# Patient Record
Sex: Male | Born: 1940 | Race: Black or African American | Hispanic: No | Marital: Single | Smoking: Current every day smoker
Health system: Southern US, Community
[De-identification: ages and names within clinical notes are randomized; demographics above are authoritative.]

## PROBLEM LIST (undated history)

## (undated) DIAGNOSIS — I10 Essential (primary) hypertension: Secondary | ICD-10-CM

## (undated) HISTORY — PX: HERNIA REPAIR: SHX51

---

## 2018-06-17 ENCOUNTER — Inpatient Hospital Stay (HOSPITAL_BASED_OUTPATIENT_CLINIC_OR_DEPARTMENT_OTHER)
Admission: EM | Admit: 2018-06-17 | Discharge: 2018-06-21 | DRG: 343 | Disposition: A | Discharge status: Home / Self Care | Payer: Medicare HMO | Attending: Surgery | Admitting: Surgery

## 2018-06-17 ENCOUNTER — Encounter (HOSPITAL_BASED_OUTPATIENT_CLINIC_OR_DEPARTMENT_OTHER): Payer: Self-pay

## 2018-06-17 ENCOUNTER — Emergency Department (HOSPITAL_BASED_OUTPATIENT_CLINIC_OR_DEPARTMENT_OTHER): Payer: Medicare HMO

## 2018-06-17 ENCOUNTER — Other Ambulatory Visit: Payer: Self-pay

## 2018-06-17 DIAGNOSIS — K3532 Acute appendicitis with perforation and localized peritonitis, without abscess: Secondary | ICD-10-CM | POA: Diagnosis present

## 2018-06-17 DIAGNOSIS — K3580 Unspecified acute appendicitis: Secondary | ICD-10-CM

## 2018-06-17 DIAGNOSIS — I10 Essential (primary) hypertension: Secondary | ICD-10-CM | POA: Diagnosis present

## 2018-06-17 DIAGNOSIS — I77811 Abdominal aortic ectasia: Secondary | ICD-10-CM | POA: Diagnosis present

## 2018-06-17 DIAGNOSIS — A599 Trichomoniasis, unspecified: Secondary | ICD-10-CM | POA: Diagnosis not present

## 2018-06-17 DIAGNOSIS — K352 Acute appendicitis with generalized peritonitis, without abscess: Secondary | ICD-10-CM | POA: Diagnosis not present

## 2018-06-17 DIAGNOSIS — K37 Unspecified appendicitis: Secondary | ICD-10-CM | POA: Diagnosis present

## 2018-06-17 DIAGNOSIS — F17200 Nicotine dependence, unspecified, uncomplicated: Secondary | ICD-10-CM | POA: Diagnosis present

## 2018-06-17 DIAGNOSIS — R935 Abnormal findings on diagnostic imaging of other abdominal regions, including retroperitoneum: Secondary | ICD-10-CM

## 2018-06-17 HISTORY — DX: Essential (primary) hypertension: I10

## 2018-06-17 LAB — URINALYSIS, MICROSCOPIC (REFLEX): WBC, UA: >50 WBC/hpf (ref 0–5)

## 2018-06-17 LAB — CBC
HCT: 45.4 % (ref 39.0–52.0)
Hemoglobin: 14.9 g/dL (ref 13.0–17.0)
MCH: 32.5 pg (ref 26.0–34.0)
MCHC: 32.8 g/dL (ref 30.0–36.0)
MCV: 99.1 fL (ref 80.0–100.0)
Platelets: 211 10*3/uL (ref 150–400)
RBC: 4.58 MIL/uL (ref 4.22–5.81)
RDW: 12.5 % (ref 11.5–15.5)
WBC: 17.6 10*3/uL — ABNORMAL HIGH (ref 4.0–10.5)
nRBC: 0 % (ref 0.0–0.2)

## 2018-06-17 LAB — COMPREHENSIVE METABOLIC PANEL
ALT: 12 U/L (ref 0–44)
AST: 22 U/L (ref 15–41)
Albumin: 4.3 g/dL (ref 3.5–5.0)
Alkaline Phosphatase: 89 U/L (ref 38–126)
Anion gap: 7 (ref 5–15)
BILIRUBIN TOTAL: 0.9 mg/dL (ref 0.3–1.2)
BUN: 9 mg/dL (ref 8–23)
CO2: 30 mmol/L (ref 22–32)
CREATININE: 0.91 mg/dL (ref 0.61–1.24)
Calcium: 9.4 mg/dL (ref 8.9–10.3)
Chloride: 96 mmol/L — ABNORMAL LOW (ref 98–111)
GFR calc Af Amer: >60 mL/min (ref >60)
GFR calc non Af Amer: >60 mL/min (ref >60)
Glucose, Bld: 137 mg/dL — ABNORMAL HIGH (ref 70–99)
Potassium: 3.5 mmol/L (ref 3.5–5.1)
Sodium: 133 mmol/L — ABNORMAL LOW (ref 135–145)
TOTAL PROTEIN: 8 g/dL (ref 6.5–8.1)

## 2018-06-17 LAB — URINALYSIS, ROUTINE W REFLEX MICROSCOPIC
BILIRUBIN URINE: NEGATIVE
Glucose, UA: NEGATIVE mg/dL
Ketones, ur: NEGATIVE mg/dL
Nitrite: NEGATIVE
Protein, ur: NEGATIVE mg/dL
Specific Gravity, Urine: 1.01 (ref 1.005–1.030)
pH: 6.5 (ref 5.0–8.0)

## 2018-06-17 LAB — LIPASE, BLOOD: Lipase: 28 U/L (ref 11–51)

## 2018-06-17 MED ORDER — ONDANSETRON HCL 4 MG/2ML IJ SOLN
4.0000 mg | Freq: Once | INTRAMUSCULAR | Status: AC
  Administered 2018-06-17: 4 mg via INTRAVENOUS
  Filled 2018-06-17: qty 2

## 2018-06-17 MED ORDER — SODIUM CHLORIDE 0.9 % IV SOLN
2.0000 g | INTRAVENOUS | Status: DC
  Administered 2018-06-17: 2 g via INTRAVENOUS
  Filled 2018-06-17: qty 20

## 2018-06-17 MED ORDER — KCL IN DEXTROSE-NACL 20-5-0.9 MEQ/L-%-% IV SOLN
INTRAVENOUS | Status: DC
  Administered 2018-06-18: 01:00:00 via INTRAVENOUS
  Filled 2018-06-17 (×2): qty 1000

## 2018-06-17 MED ORDER — PIPERACILLIN-TAZOBACTAM 3.375 G IVPB 30 MIN
3.3750 g | Freq: Once | INTRAVENOUS | Status: AC
  Administered 2018-06-17: 3.375 g via INTRAVENOUS
  Filled 2018-06-17 (×2): qty 50

## 2018-06-17 MED ORDER — MORPHINE SULFATE (PF) 4 MG/ML IV SOLN
4.0000 mg | Freq: Once | INTRAVENOUS | Status: AC
  Administered 2018-06-17: 4 mg via INTRAVENOUS
  Filled 2018-06-17: qty 1

## 2018-06-17 MED ORDER — ONDANSETRON HCL 4 MG/2ML IJ SOLN: 4.0000 mg | Freq: Four times a day (QID) | INTRAMUSCULAR | Status: DC | PRN

## 2018-06-17 MED ORDER — ONDANSETRON 4 MG PO TBDP: 4.0000 mg | ORAL_TABLET | Freq: Four times a day (QID) | ORAL | Status: DC | PRN

## 2018-06-17 MED ORDER — PANTOPRAZOLE SODIUM 40 MG IV SOLR
40.0000 mg | Freq: Every day | INTRAVENOUS | Status: DC
  Administered 2018-06-17: 40 mg via INTRAVENOUS
  Filled 2018-06-17: qty 40

## 2018-06-17 MED ORDER — MORPHINE SULFATE (PF) 2 MG/ML IV SOLN
1.0000 mg | INTRAVENOUS | Status: DC | PRN
  Administered 2018-06-18 (×2): 4 mg via INTRAVENOUS
  Filled 2018-06-17 (×2): qty 2

## 2018-06-17 MED ORDER — IOPAMIDOL (ISOVUE-300) INJECTION 61%
100.0000 mL | Freq: Once | INTRAVENOUS | Status: AC | PRN
  Administered 2018-06-17: 100 mL via INTRAVENOUS

## 2018-06-17 MED ORDER — METRONIDAZOLE IN NACL 5-0.79 MG/ML-% IV SOLN
500.0000 mg | Freq: Three times a day (TID) | INTRAVENOUS | Status: DC
  Administered 2018-06-18 (×2): 500 mg via INTRAVENOUS
  Filled 2018-06-17 (×2): qty 100

## 2018-06-17 MED ORDER — SODIUM CHLORIDE 0.9 % IV BOLUS
1000.0000 mL | Freq: Once | INTRAVENOUS | Status: AC
  Administered 2018-06-17: 1000 mL via INTRAVENOUS

## 2018-06-17 NOTE — ED Notes (Signed)
Report given at [facility]

## 2018-06-17 NOTE — ED Provider Notes (Signed)
[facility] EMERGENCY DEPARTMENT Provider Note   CSN: [id number] Arrival date & time: 06/17/18  1602     History   Chief Complaint Chief Complaint  Patient presents with  . Abdominal Pain    HPI Larry Howard is a 78 y.o. male.  Pt presents to the ED today with RLQ abd pain.  Pt said it started last night.  He had n/v yesterday, but none today.  He has not had a fever.  He has never had any surgery on his abdomen.     Past Medical History:  Diagnosis Date  . Hypertension    High cholesterol    Home Medications    Prior to Admission medications   Not on File   Pt unsure of med names  Family History No family history on file.  Social History Social History   Tobacco Use  . Smoking status: Current Every Day Smoker  . Smokeless tobacco: Never Used  Substance Use Topics  . Alcohol use: Never    Frequency: Never  . Drug use: Never     Allergies   Patient has no known allergies.   Review of Systems Review of Systems  Gastrointestinal: Positive for abdominal pain, nausea and vomiting.  All other systems reviewed and are negative.    Physical Exam Updated Vital Signs BP (!) 184/105 (BP Location: Right Arm)   Pulse 94   Temp 99.2 F (37.3 C)   Resp 16   Ht 5\' 2"  (1.575 m)   Wt 97.5 kg   SpO2 94%   BMI 39.32 kg/m   Physical Exam Vitals signs and nursing note reviewed.  Constitutional:      Appearance: He is well-developed.  HENT:     Head: Normocephalic and atraumatic.     Mouth/Throat:     Mouth: Mucous membranes are moist.     Pharynx: Oropharynx is clear.  Eyes:     Extraocular Movements: Extraocular movements intact.     Pupils: Pupils are equal, round, and reactive to light.  Cardiovascular:     Rate and Rhythm: Normal rate and regular rhythm.     Heart sounds: Normal heart sounds.  Pulmonary:     Effort: Pulmonary effort is normal.     Breath sounds: Normal breath sounds.  Abdominal:     General: Abdomen is flat.  Bowel sounds are normal.     Palpations: Abdomen is soft.     Tenderness: There is abdominal tenderness in the right upper quadrant and right lower quadrant.  Skin:    General: Skin is warm.     Capillary Refill: Capillary refill takes less than 2 seconds.  Neurological:     General: No focal deficit present.     Mental Status: He is alert and oriented to person, place, and time.  Psychiatric:        Mood and Affect: Mood normal.        Behavior: Behavior normal.      ED Treatments / Results  Labs (all labs ordered are listed, but only abnormal results are displayed) Labs Reviewed  COMPREHENSIVE METABOLIC PANEL - Abnormal; Notable for the following components:      Result Value   Sodium 133 (*)    Chloride 96 (*)    Glucose, Bld 137 (*)    All other components within normal limits  CBC - Abnormal; Notable for the following components:   WBC 17.6 (*)    All other components within normal limits  URINALYSIS, ROUTINE  W REFLEX MICROSCOPIC - Abnormal; Notable for the following components:   APPearance HAZY (*)    Hgb urine dipstick TRACE (*)    Leukocytes, UA MODERATE (*)    All other components within normal limits  URINALYSIS, MICROSCOPIC (REFLEX) - Abnormal; Notable for the following components:   Bacteria, UA FEW (*)    Trichomonas, UA PRESENT (*)    All other components within normal limits  LIPASE, BLOOD    EKG None  Radiology Ct Abdomen Pelvis W Contrast  Result Date: 06/17/2018 CLINICAL DATA:  Right lower quadrant pain, onset yesterday. Nausea and vomiting. Concern for appendicitis. EXAM: CT ABDOMEN AND PELVIS WITH CONTRAST TECHNIQUE: Multidetector CT imaging of the abdomen and pelvis was performed using the standard protocol following bolus administration of intravenous contrast. CONTRAST:  ISOVUE-300 IOPAMIDOL (ISOVUE-300) INJECTION 61% COMPARISON:  None. FINDINGS: Lower chest: Minor hypoventilatory atelectasis at the bases. No pleural fluid. Hepatobiliary: No  focal liver abnormality is seen. No gallstones, gallbladder wall thickening, or biliary dilatation. Pancreas: No ductal dilatation or inflammation. Spleen: Normal in size without focal abnormality. Tiny splenule anteriorly. Adrenals/Urinary Tract: Mild left adrenal thickening without dominant nodule. Normal right adrenal gland. No hydronephrosis or perinephric edema. Homogeneous renal enhancement with symmetric excretion on delayed phase imaging. Urinary bladder is completely nondistended and not well evaluated. Stomach/Bowel: Evidence of acute appendicitis as described below. Small hiatal hernia. Stomach physiologically distended. 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 Remaining small bowel is unremarkable. Mild diverticulosis of the descending and hepatic flexure of the colon without diverticulitis. Appendix: Location: Posterior to the cecum. Diameter: 18 mm. Appendicolith: Yes, 9 mm at the base, smaller in the mid appendix. Mucosal hyper-enhancement: Present. Extraluminal gas: No. Periappendiceal collection: No well-defined collection. Prominent periappendiceal stranding and small amount of free fluid. 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 Mesenteric vessels and portal vein are patent. No adenopathy. Reproductive: Prostate is unremarkable. Other: Periappendiceal stranding and free fluid in the right lower quadrant without well-defined abscess. Small amount of free fluid tracks into the dependent pelvis. No free air. No abdominal wall hernia. Musculoskeletal: Degenerative change in the spine, sacroiliac joints, and hips. There are no acute or suspicious osseous abnormalities. IMPRESSION: 1. Acute appendicitis. Prominent periappendiceal inflammation and small amount of free fluid but no evidence of rupture. No abscess or extraluminal air. 2. Mild colonic diverticulosis without diverticulitis. 3.  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 Recommend followup by ultrasound in 5 years. This recommendation follows ACR consensus guidelines: White Paper of the ACR Incidental Findings Committee II on Vascular Findings. J Am Coll Radiol 2013; 10:789-794. Electronically Signed   By: Narda Rutherford M.D.   On: 06/17/2018 20:22    Procedures Procedures (including critical care time)  Medications Ordered in ED Medications  piperacillin-tazobactam (ZOSYN) IVPB 3.375 g (has no administration in time range)  ondansetron (ZOFRAN) injection 4 mg (4 mg Intravenous Given 06/17/18 1928)  morphine 4 MG/ML injection 4 mg (4 mg Intravenous Given 06/17/18 1929)  sodium chloride 0.9 % bolus 1,000 mL (1,000 mLs Intravenous New Bag/Given 06/17/18 1928)  iopamidol (ISOVUE-300) 61 % injection 100 mL (100 mLs Intravenous Contrast Given 06/17/18 1951)     Initial Impression / Assessment and Plan / ED Course  I have reviewed the triage vital signs and the nursing notes.  Pertinent labs & imaging results that were available during my care of the patient were  reviewed by me and considered in my medical decision making (see chart for details).    Pt d/w Dr. Carolynne Edouardoth (surgery) who [facility] as he wishes to see pt in ED.  Pt d/w Dr. Julieanne Mansonegler (ED attending) who accepted pt for transfer.  Pt given Zosyn prior to transfer.   Final Clinical Impressions(s) / ED Diagnoses   Final diagnoses:  Acute appendicitis, unspecified acute appendicitis type    ED Discharge Orders    None       Jacalyn LefevreHaviland, Jamiyla Ishee, MD 06/17/18 2042

## 2018-06-17 NOTE — ED Provider Notes (Signed)
Pt transferred from [facility] for acute appendicitis found on CT, given zosyn PTA. On exam, he is comfortable, VSS. Pain 8/10. Dr. Carolynne Edouard, EGS, evaluated in ED and will admit for further management.    Jancie Kercher, Ambrose Finland, MD 06/17/18 2257

## 2018-06-17 NOTE — ED Triage Notes (Signed)
C/o RLQ pain started yesterday-n/v yesterday-none today-NAD-to triage in w/c

## 2018-06-17 NOTE — H&P (Signed)
Larry Howard is an 78 y.o. male.   Chief Complaint: abd pain HPI: Larry Howard is a 78 year old black male who began having abdominal pain yesterday afternoon.  He denies any fevers or chills.  He denies any nausea or vomiting.  His pain has gotten progressively worse and he came to Larry emergency department today.  A CT scan showed inflammation around Larry appendix consistent with acute appendicitis.  Past Medical History:  Diagnosis Date  . Hypertension     History reviewed. No pertinent surgical history.  No family history on file. Social History:  reports that he has been smoking. He has never used smokeless tobacco. He reports that he does not drink alcohol or use drugs.  Allergies: No Known Allergies  (Not in a hospital admission)   Results for orders placed or performed during Larry hospital encounter of 06/17/18 (from Larry past 48 hour(s))  Urinalysis, Routine w reflex microscopic     Status: Abnormal   Collection Time: 06/17/18  4:32 PM  Result Value Ref Range   Color, Urine YELLOW YELLOW   APPearance HAZY (A) CLEAR   Specific Gravity, Urine 1.010 1.005 - 1.030   pH 6.5 5.0 - 8.0   Glucose, UA NEGATIVE NEGATIVE mg/dL   Hgb urine dipstick TRACE (A) NEGATIVE   Bilirubin Urine NEGATIVE NEGATIVE   Ketones, ur NEGATIVE NEGATIVE mg/dL   Protein, ur NEGATIVE NEGATIVE mg/dL   Nitrite NEGATIVE NEGATIVE   Leukocytes, UA MODERATE (A) NEGATIVE    Comment: Performed at [facility], [address] [place], [place] [address]  Urinalysis, Microscopic (reflex)     Status: Abnormal   Collection Time: 06/17/18  4:32 PM  Result Value Ref Range   RBC / HPF 0-5 0 - 5 RBC/hpf   WBC, UA >50 0 - 5 WBC/hpf   Bacteria, UA FEW (A) NONE SEEN   Squamous Epithelial / LPF 0-5 0 - 5   Trichomonas, UA PRESENT (A) NONE SEEN    Comment: Performed at [facility], [address] [place], [place] [address]  Lipase, blood     Status: None   Collection Time: 06/17/18  5:42 PM   Result Value Ref Range   Lipase 28 11 - 51 U/L    Comment: Performed at [facility], [address] [place], [place] [address]  Comprehensive metabolic panel     Status: Abnormal   Collection Time: 06/17/18  5:42 PM  Result Value Ref Range   Sodium 133 (L) 135 - 145 mmol/L   Potassium 3.5 3.5 - 5.1 mmol/L   Chloride 96 (L) 98 - 111 mmol/L   CO2 30 22 - 32 mmol/L   Glucose, Bld 137 (H) 70 - 99 mg/dL   BUN 9 8 - 23 mg/dL   Creatinine, Ser 3.22 0.61 - 1.24 mg/dL   Calcium 9.4 8.9 - 02.5 mg/dL   Total Protein 8.0 6.5 - 8.1 g/dL   Albumin 4.3 3.5 - 5.0 g/dL   AST 22 15 - 41 U/L   ALT 12 0 - 44 U/L   Alkaline Phosphatase 89 38 - 126 U/L   Total Bilirubin 0.9 0.3 - 1.2 mg/dL   GFR calc non Af Amer >60 >60 mL/min   GFR calc Af Amer >60 >60 mL/min   Anion gap 7 5 - 15    Comment: Performed at [facility], [address] [place], [place] [address]  CBC     Status:  Abnormal   Collection Time: 06/17/18  5:42 PM  Result Value Ref Range   WBC 17.6 (H) 4.0 - 10.5 K/uL   RBC 4.58 4.22 - 5.81 MIL/uL   Hemoglobin 14.9 13.0 - 17.0 g/dL   HCT 16.145.4 09.639.0 - 04.552.0 %   MCV 99.1 80.0 - 100.0 fL   MCH 32.5 26.0 - 34.0 pg   MCHC 32.8 30.0 - 36.0 g/dL   RDW 40.912.5 81.111.5 - 91.415.5 %   Platelets 211 150 - 400 K/uL   nRBC 0.0 0.0 - 0.2 %    Comment: Performed at [facility], [address] [place], [place] [address]   Ct Abdomen Pelvis W Contrast  Result Date: 06/17/2018 CLINICAL DATA:  Right lower quadrant pain, onset yesterday. Nausea and vomiting. Concern for appendicitis. EXAM: CT ABDOMEN AND PELVIS WITH CONTRAST TECHNIQUE: Multidetector CT imaging of Larry abdomen and pelvis was performed using Larry standard protocol following bolus administration of intravenous contrast. CONTRAST:  100mL ISOVUE-300 IOPAMIDOL (ISOVUE-300) INJECTION 61% COMPARISON:  None. FINDINGS: Lower chest: Minor hypoventilatory atelectasis at Larry bases. No pleural fluid. Hepatobiliary: No focal liver  abnormality is seen. No gallstones, gallbladder wall thickening, or biliary dilatation. Pancreas: No ductal dilatation or inflammation. Spleen: Normal in size without focal abnormality. Tiny splenule anteriorly. Adrenals/Urinary Tract: Mild left adrenal thickening without dominant nodule. Normal right adrenal gland. No hydronephrosis or perinephric edema. Homogeneous renal enhancement with symmetric excretion on delayed phase imaging. Urinary bladder is completely nondistended and not well evaluated. Stomach/Bowel: Evidence of acute appendicitis as described below. Small hiatal hernia. Stomach physiologically distended. Incidental intramural tubular lipoma in Larry fourth portion of Larry duodenum spans 3.2 cm, no obstruction. Remaining small bowel is unremarkable. Mild diverticulosis of Larry descending and hepatic flexure of Larry colon without diverticulitis. Appendix: Location: Posterior to Larry cecum. Diameter: 18 mm. Appendicolith: Yes, 9 mm at Larry base, smaller in Larry mid appendix. Mucosal hyper-enhancement: Present. Extraluminal gas: No. Periappendiceal collection: No well-defined collection. Prominent periappendiceal stranding and small amount of free fluid. 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 Mesenteric vessels and portal vein are patent. No adenopathy. Reproductive: Prostate is unremarkable. Other: Periappendiceal stranding and free fluid in Larry right lower quadrant without well-defined abscess. Small amount of free fluid tracks into Larry dependent pelvis. No free air. No abdominal wall hernia. Musculoskeletal: Degenerative change in Larry spine, sacroiliac joints, and hips. There are no acute or suspicious osseous abnormalities. IMPRESSION: 1. Acute appendicitis. Prominent periappendiceal inflammation and small amount of free fluid but no evidence of rupture. No abscess or extraluminal air. 2. Mild colonic diverticulosis without diverticulitis. 3. Ectatic infrarenal  aorta at 2.7 cm. Ectatic abdominal aorta at risk for aneurysm development. Recommend followup by ultrasound in 5 years. This recommendation follows ACR consensus guidelines: White Paper of Larry ACR Incidental Findings Committee II on Vascular Findings. J Am Coll Radiol 2013; 10:789-794. Electronically Signed   By: Narda RutherfordMelanie  Sanford M.D.   On: 06/17/2018 20:22    Review of Systems  Constitutional: Negative.   HENT: Negative.   Eyes: Negative.   Respiratory: Negative.   Cardiovascular: Negative.   Gastrointestinal: Positive for abdominal pain. Negative for nausea and vomiting.  Genitourinary: Negative.   Musculoskeletal: Negative.   Skin: Negative.   Neurological: Negative.   Endo/Heme/Allergies: Negative.   Psychiatric/Behavioral: Negative.     Blood pressure 124/75, pulse (!) 102, temperature 99.4 F (37.4 C), temperature source Oral, resp. rate 16, height 5\' 2"  (1.575 m), weight 97.5 kg, SpO2  93 %. Physical Exam  Constitutional: He is oriented to person, place, and time. He appears well-developed and well-nourished. No distress.  HENT:  Head: Normocephalic and atraumatic.  Mouth/Throat: No oropharyngeal exudate.  Eyes: Pupils are equal, round, and reactive to light. Conjunctivae and EOM are normal.  Neck: Normal range of motion. Neck supple.  Respiratory: No stridor.  GI: Soft. He exhibits no distension. There is abdominal tenderness.  Musculoskeletal: Normal range of motion.        General: No tenderness or edema.  Neurological: He is alert and oriented to person, place, and time. Coordination normal.  Skin: Skin is warm and dry. No rash noted.  Psychiatric: He has a normal mood and affect. His behavior is normal. Thought content normal.     Assessment/Plan Larry Howard appears to have acute appendicitis.  Because of Larry risk of rupture and sepsis I think he would benefit from having his appendix removed.  I have discussed with him in detail Larry risks and benefits of Larry operation  as well as some of Larry technical aspects and he understands and wishes to proceed.  We will likely admit him to Larry hospital and start him on broad-spectrum antibiotic therapy and discuss surgery with Larry primary team first thing in Larry morning.  Chevis Pretty III, MD 06/17/2018, 10:39 PM

## 2018-06-17 NOTE — ED Notes (Signed)
Patient transported to CT 

## 2018-06-17 NOTE — ED Notes (Signed)
Dr Toth at bedside. 

## 2018-06-18 ENCOUNTER — Encounter (HOSPITAL_COMMUNITY): Admission: EM | Disposition: A | Discharge status: Home / Self Care | Payer: Self-pay | Source: Home / Self Care

## 2018-06-18 ENCOUNTER — Other Ambulatory Visit: Payer: Self-pay

## 2018-06-18 ENCOUNTER — Observation Stay (HOSPITAL_COMMUNITY): Payer: Medicare HMO | Admitting: Anesthesiology

## 2018-06-18 ENCOUNTER — Encounter (HOSPITAL_COMMUNITY): Payer: Self-pay | Admitting: *Deleted

## 2018-06-18 DIAGNOSIS — F17200 Nicotine dependence, unspecified, uncomplicated: Secondary | ICD-10-CM | POA: Diagnosis present

## 2018-06-18 DIAGNOSIS — I77811 Abdominal aortic ectasia: Secondary | ICD-10-CM | POA: Diagnosis present

## 2018-06-18 DIAGNOSIS — K3532 Acute appendicitis with perforation and localized peritonitis, without abscess: Secondary | ICD-10-CM | POA: Diagnosis present

## 2018-06-18 DIAGNOSIS — K352 Acute appendicitis with generalized peritonitis, without abscess: Secondary | ICD-10-CM | POA: Diagnosis present

## 2018-06-18 DIAGNOSIS — K3580 Unspecified acute appendicitis: Secondary | ICD-10-CM | POA: Diagnosis present

## 2018-06-18 DIAGNOSIS — A599 Trichomoniasis, unspecified: Secondary | ICD-10-CM | POA: Diagnosis present

## 2018-06-18 DIAGNOSIS — I10 Essential (primary) hypertension: Secondary | ICD-10-CM | POA: Diagnosis present

## 2018-06-18 HISTORY — PX: LAPAROSCOPIC APPENDECTOMY: SHX408

## 2018-06-18 LAB — CBC
HCT: 41.6 % (ref 39.0–52.0)
Hemoglobin: 13.2 g/dL (ref 13.0–17.0)
MCH: 33.4 pg (ref 26.0–34.0)
MCHC: 31.7 g/dL (ref 30.0–36.0)
MCV: 105.3 fL — AB (ref 80.0–100.0)
NRBC: 0 % (ref 0.0–0.2)
PLATELETS: 180 10*3/uL (ref 150–400)
RBC: 3.95 MIL/uL — ABNORMAL LOW (ref 4.22–5.81)
RDW: 13.1 % (ref 11.5–15.5)
WBC: 17.8 10*3/uL — ABNORMAL HIGH (ref 4.0–10.5)

## 2018-06-18 LAB — CREATININE, SERUM
Creatinine, Ser: 1.24 mg/dL (ref 0.61–1.24)
GFR calc Af Amer: >60 mL/min (ref >60)
GFR calc non Af Amer: 57 mL/min — ABNORMAL LOW (ref >60)

## 2018-06-18 LAB — MRSA PCR SCREENING: MRSA by PCR: NEGATIVE

## 2018-06-18 SURGERY — APPENDECTOMY, LAPAROSCOPIC
Anesthesia: General

## 2018-06-18 MED ORDER — EPHEDRINE SULFATE-NACL 50-0.9 MG/10ML-% IV SOSY
PREFILLED_SYRINGE | INTRAVENOUS | Status: DC | PRN
  Administered 2018-06-18: 10 mg via INTRAVENOUS

## 2018-06-18 MED ORDER — ROCURONIUM BROMIDE 10 MG/ML (PF) SYRINGE
PREFILLED_SYRINGE | INTRAVENOUS | Status: DC | PRN
  Administered 2018-06-18: 50 mg via INTRAVENOUS

## 2018-06-18 MED ORDER — FENTANYL CITRATE (PF) 100 MCG/2ML IJ SOLN
INTRAMUSCULAR | Status: DC | PRN
  Administered 2018-06-18: 50 ug via INTRAVENOUS
  Administered 2018-06-18: 100 ug via INTRAVENOUS

## 2018-06-18 MED ORDER — ONDANSETRON HCL 4 MG/2ML IJ SOLN
INTRAMUSCULAR | Status: AC
  Filled 2018-06-18: qty 2

## 2018-06-18 MED ORDER — OXYCODONE HCL 5 MG/5ML PO SOLN: 5.0000 mg | Freq: Once | ORAL | Status: DC | PRN

## 2018-06-18 MED ORDER — ACETAMINOPHEN 650 MG RE SUPP: 650.0000 mg | Freq: Four times a day (QID) | RECTAL | Status: DC | PRN

## 2018-06-18 MED ORDER — 0.9 % SODIUM CHLORIDE (POUR BTL) OPTIME
TOPICAL | Status: DC | PRN
  Administered 2018-06-18: 1000 mL

## 2018-06-18 MED ORDER — ALBUTEROL SULFATE HFA 108 (90 BASE) MCG/ACT IN AERS
INHALATION_SPRAY | RESPIRATORY_TRACT | Status: AC
  Filled 2018-06-18: qty 6.7

## 2018-06-18 MED ORDER — ONDANSETRON 4 MG PO TBDP: 4.0000 mg | ORAL_TABLET | Freq: Four times a day (QID) | ORAL | Status: DC | PRN

## 2018-06-18 MED ORDER — LIDOCAINE 2% (20 MG/ML) 5 ML SYRINGE
INTRAMUSCULAR | Status: AC
  Filled 2018-06-18: qty 5

## 2018-06-18 MED ORDER — ROCURONIUM BROMIDE 10 MG/ML (PF) SYRINGE
PREFILLED_SYRINGE | INTRAVENOUS | Status: AC
  Filled 2018-06-18: qty 10

## 2018-06-18 MED ORDER — ONDANSETRON HCL 4 MG/2ML IJ SOLN: 4.0000 mg | Freq: Four times a day (QID) | INTRAMUSCULAR | Status: DC | PRN

## 2018-06-18 MED ORDER — SUGAMMADEX SODIUM 200 MG/2ML IV SOLN
INTRAVENOUS | Status: DC | PRN
  Administered 2018-06-18: 200 mg via INTRAVENOUS

## 2018-06-18 MED ORDER — DEXAMETHASONE SODIUM PHOSPHATE 10 MG/ML IJ SOLN
INTRAMUSCULAR | Status: AC
  Filled 2018-06-18: qty 1

## 2018-06-18 MED ORDER — PHENYLEPHRINE HCL 10 MG/ML IJ SOLN
INTRAMUSCULAR | Status: AC
  Filled 2018-06-18: qty 1

## 2018-06-18 MED ORDER — BUPIVACAINE-EPINEPHRINE (PF) 0.25% -1:200000 IJ SOLN
INTRAMUSCULAR | Status: AC
  Filled 2018-06-18: qty 30

## 2018-06-18 MED ORDER — FENTANYL CITRATE (PF) 250 MCG/5ML IJ SOLN
INTRAMUSCULAR | Status: AC
  Filled 2018-06-18: qty 5

## 2018-06-18 MED ORDER — BUPIVACAINE-EPINEPHRINE 0.25% -1:200000 IJ SOLN
INTRAMUSCULAR | Status: DC | PRN
  Administered 2018-06-18: 20 mL

## 2018-06-18 MED ORDER — ENOXAPARIN SODIUM 40 MG/0.4ML ~~LOC~~ SOLN
40.0000 mg | SUBCUTANEOUS | Status: DC
  Administered 2018-06-20 – 2018-06-21 (×2): 40 mg via SUBCUTANEOUS
  Filled 2018-06-18 (×2): qty 0.4

## 2018-06-18 MED ORDER — LACTATED RINGERS IR SOLN
Status: DC | PRN
  Administered 2018-06-18: 1000 mL

## 2018-06-18 MED ORDER — HYDROMORPHONE HCL 1 MG/ML IJ SOLN: 1.0000 mg | INTRAMUSCULAR | Status: DC | PRN

## 2018-06-18 MED ORDER — KCL IN DEXTROSE-NACL 20-5-0.45 MEQ/L-%-% IV SOLN
INTRAVENOUS | Status: DC
  Administered 2018-06-18: 13:00:00 via INTRAVENOUS
  Filled 2018-06-18 (×2): qty 1000

## 2018-06-18 MED ORDER — ALBUMIN HUMAN 5 % IV SOLN
INTRAVENOUS | Status: DC | PRN
  Administered 2018-06-18: 10:00:00 via INTRAVENOUS

## 2018-06-18 MED ORDER — SUCCINYLCHOLINE CHLORIDE 200 MG/10ML IV SOSY
PREFILLED_SYRINGE | INTRAVENOUS | Status: DC | PRN
  Administered 2018-06-18: 120 mg via INTRAVENOUS

## 2018-06-18 MED ORDER — LACTATED RINGERS IV SOLN
INTRAVENOUS | Status: DC
  Administered 2018-06-18: 08:00:00 via INTRAVENOUS

## 2018-06-18 MED ORDER — HYDROCODONE-ACETAMINOPHEN 5-325 MG PO TABS: 1.0000 | ORAL_TABLET | ORAL | Status: DC | PRN

## 2018-06-18 MED ORDER — ALBUTEROL SULFATE HFA 108 (90 BASE) MCG/ACT IN AERS
INHALATION_SPRAY | RESPIRATORY_TRACT | Status: DC | PRN
  Administered 2018-06-18: 2 via RESPIRATORY_TRACT
  Administered 2018-06-18: 4 via RESPIRATORY_TRACT

## 2018-06-18 MED ORDER — PROPOFOL 10 MG/ML IV BOLUS
INTRAVENOUS | Status: AC
  Filled 2018-06-18: qty 20

## 2018-06-18 MED ORDER — EPHEDRINE 5 MG/ML INJ
INTRAVENOUS | Status: AC
  Filled 2018-06-18: qty 10

## 2018-06-18 MED ORDER — TRAMADOL HCL 50 MG PO TABS
50.0000 mg | ORAL_TABLET | Freq: Four times a day (QID) | ORAL | Status: DC | PRN
  Administered 2018-06-19: 50 mg via ORAL
  Filled 2018-06-18: qty 1

## 2018-06-18 MED ORDER — OXYCODONE HCL 5 MG PO TABS: 5.0000 mg | ORAL_TABLET | Freq: Once | ORAL | Status: DC | PRN

## 2018-06-18 MED ORDER — SUCCINYLCHOLINE CHLORIDE 200 MG/10ML IV SOSY
PREFILLED_SYRINGE | INTRAVENOUS | Status: AC
  Filled 2018-06-18: qty 10

## 2018-06-18 MED ORDER — FENTANYL CITRATE (PF) 100 MCG/2ML IJ SOLN: 25.0000 ug | INTRAMUSCULAR | Status: DC | PRN

## 2018-06-18 MED ORDER — PROPOFOL 10 MG/ML IV BOLUS
INTRAVENOUS | Status: DC | PRN
  Administered 2018-06-18: 140 mg via INTRAVENOUS

## 2018-06-18 MED ORDER — ACETAMINOPHEN 325 MG PO TABS: 650.0000 mg | ORAL_TABLET | Freq: Four times a day (QID) | ORAL | Status: DC | PRN

## 2018-06-18 MED ORDER — PIPERACILLIN-TAZOBACTAM 3.375 G IVPB
3.3750 g | Freq: Three times a day (TID) | INTRAVENOUS | Status: DC
  Administered 2018-06-18 – 2018-06-21 (×9): 3.375 g via INTRAVENOUS
  Filled 2018-06-18 (×7): qty 50

## 2018-06-18 MED ORDER — PHENYLEPHRINE 40 MCG/ML (10ML) SYRINGE FOR IV PUSH (FOR BLOOD PRESSURE SUPPORT)
PREFILLED_SYRINGE | INTRAVENOUS | Status: DC | PRN
  Administered 2018-06-18: 120 ug via INTRAVENOUS
  Administered 2018-06-18 (×3): 100 ug via INTRAVENOUS

## 2018-06-18 MED ORDER — ONDANSETRON HCL 4 MG/2ML IJ SOLN: 4.0000 mg | Freq: Once | INTRAMUSCULAR | Status: DC | PRN

## 2018-06-18 MED ORDER — DEXAMETHASONE SODIUM PHOSPHATE 10 MG/ML IJ SOLN
INTRAMUSCULAR | Status: DC | PRN
  Administered 2018-06-18: 10 mg via INTRAVENOUS

## 2018-06-18 MED ORDER — PHENYLEPHRINE 40 MCG/ML (10ML) SYRINGE FOR IV PUSH (FOR BLOOD PRESSURE SUPPORT)
PREFILLED_SYRINGE | INTRAVENOUS | Status: AC
  Filled 2018-06-18: qty 10

## 2018-06-18 SURGICAL SUPPLY — 40 items
APPLIER CLIP ROT 10 11.4 M/L (STAPLE)
CATH FOLEY 2WAY 5CC 16FR (CATHETERS)
CATH URTH STD 16FR FL 2W DRN (CATHETERS) IMPLANT
CHLORAPREP W/TINT 26ML (MISCELLANEOUS) ×3 IMPLANT
CLIP APPLIE ROT 10 11.4 M/L (STAPLE) IMPLANT
COVER SURGICAL LIGHT HANDLE (MISCELLANEOUS) ×3 IMPLANT
COVER WAND RF STERILE (DRAPES) ×2 IMPLANT
CUTTER FLEX LINEAR 45M (STAPLE) ×2 IMPLANT
DERMABOND ADVANCED (GAUZE/BANDAGES/DRESSINGS) ×2
DERMABOND ADVANCED .7 DNX12 (GAUZE/BANDAGES/DRESSINGS) IMPLANT
DRAPE LAPAROSCOPIC ABDOMINAL (DRAPES) ×3 IMPLANT
ELECT REM PT RETURN 15FT ADLT (MISCELLANEOUS) ×3 IMPLANT
ENDOLOOP SUT PDS II  0 18 (SUTURE)
ENDOLOOP SUT PDS II 0 18 (SUTURE) IMPLANT
GLOVE BIOGEL PI IND STRL 6.5 (GLOVE) IMPLANT
GLOVE BIOGEL PI IND STRL 7.0 (GLOVE) IMPLANT
GLOVE BIOGEL PI INDICATOR 6.5 (GLOVE) ×2
GLOVE BIOGEL PI INDICATOR 7.0 (GLOVE) ×2
GLOVE ECLIPSE 6.5 STRL STRAW (GLOVE) ×2 IMPLANT
GLOVE SURG ORTHO 8.0 STRL STRW (GLOVE) ×3 IMPLANT
GLOVE SURG SS PI 6.0 STRL IVOR (GLOVE) ×2 IMPLANT
GOWN STRL REUS W/ TWL LRG LVL3 (GOWN DISPOSABLE) IMPLANT
GOWN STRL REUS W/TWL LRG LVL3 (GOWN DISPOSABLE) ×2
GOWN STRL REUS W/TWL XL LVL3 (GOWN DISPOSABLE) ×6 IMPLANT
KIT BASIN OR (CUSTOM PROCEDURE TRAY) ×3 IMPLANT
POUCH SPECIMEN RETRIEVAL 10MM (ENDOMECHANICALS) ×3 IMPLANT
RELOAD 45 VASCULAR/THIN (ENDOMECHANICALS) IMPLANT
RELOAD STAPLE 45 2.5 WHT GRN (ENDOMECHANICALS) IMPLANT
RELOAD STAPLE 45 3.5 BLU ETS (ENDOMECHANICALS) IMPLANT
RELOAD STAPLE TA45 3.5 REG BLU (ENDOMECHANICALS) ×3 IMPLANT
SET IRRIG TUBING LAPAROSCOPIC (IRRIGATION / IRRIGATOR) ×3 IMPLANT
SET TUBE SMOKE EVAC HIGH FLOW (TUBING) ×2 IMPLANT
SHEARS HARMONIC ACE PLUS 36CM (ENDOMECHANICALS) ×3 IMPLANT
SUT MNCRL AB 4-0 PS2 18 (SUTURE) ×5 IMPLANT
TOWEL OR 17X26 10 PK STRL BLUE (TOWEL DISPOSABLE) ×3 IMPLANT
TOWEL OR NON WOVEN STRL DISP B (DISPOSABLE) ×3 IMPLANT
TRAY FOLEY MTR SLVR 16FR STAT (SET/KITS/TRAYS/PACK) IMPLANT
TRAY LAPAROSCOPIC (CUSTOM PROCEDURE TRAY) ×3 IMPLANT
TROCAR XCEL BLUNT TIP 100MML (ENDOMECHANICALS) ×3 IMPLANT
TROCAR XCEL NON-BLD 11X100MML (ENDOMECHANICALS) ×3 IMPLANT

## 2018-06-18 NOTE — Anesthesia Procedure Notes (Signed)
Procedure Name: Intubation Date/Time: 06/18/2018 9:26 AM Performed by: Anne Fu, CRNA Pre-anesthesia Checklist: Patient identified, Emergency Drugs available, Suction available, Patient being monitored and Timeout performed Patient Re-evaluated:Patient Re-evaluated prior to induction Oxygen Delivery Method: Circle system utilized Induction Type: IV induction, Rapid sequence and Cricoid Pressure applied Ventilation: Mask ventilation without difficulty Laryngoscope Size: Mac and 4 Grade View: Grade I Tube type: Oral Tube size: 7.5 mm Number of attempts: 1 Airway Equipment and Method: Stylet Placement Confirmation: ETT inserted through vocal cords under direct vision,  positive ETCO2 and breath sounds checked- equal and bilateral Tube secured with: Tape Dental Injury: Teeth and Oropharynx as per pre-operative assessment

## 2018-06-18 NOTE — Anesthesia Preprocedure Evaluation (Addendum)
Anesthesia Evaluation  Patient identified by MRN, date of birth, ID band Patient awake    Reviewed: Allergy & Precautions, NPO status , Patient's Chart, lab work & pertinent test results  Airway Mallampati: II  TM Distance: >3 FB Neck ROM: Full    Dental   Pulmonary Current Smoker,    breath sounds clear to auscultation       Cardiovascular hypertension,  Rhythm:Regular Rate:Normal     Neuro/Psych    GI/Hepatic   Endo/Other    Renal/GU      Musculoskeletal   Abdominal   Peds  Hematology   Anesthesia Other Findings   Reproductive/Obstetrics                            Anesthesia Physical Anesthesia Plan  ASA: II and emergent  Anesthesia Plan: General   Post-op Pain Management:    Induction: Intravenous, Rapid sequence and Cricoid pressure planned  PONV Risk Score and Plan: Ondansetron and Dexamethasone  Airway Management Planned: Oral ETT  Additional Equipment:   Intra-op Plan:   Post-operative Plan: Extubation in OR  Informed Consent: I have reviewed the patients History and Physical, chart, labs and discussed the procedure including the risks, benefits and alternatives for the proposed anesthesia with the patient or authorized representative who has indicated his/her understanding and acceptance.     Plan Discussed with: CRNA and Anesthesiologist  Anesthesia Plan Comments:         Anesthesia Quick Evaluation

## 2018-06-18 NOTE — Interval H&P Note (Signed)
History and Physical Interval Note:  06/18/2018 9:10 AM  Larry Howard  has presented today for surgery, with the diagnosis of Acute Appendicitis.  The various methods of treatment have been discussed with the patient and family. After consideration of risks, benefits and other options for treatment, the patient has consented to    Procedure(s): APPENDECTOMY LAPAROSCOPIC (N/A) as a surgical intervention .    The patient's history has been reviewed, patient examined, no change in status, stable for surgery.  I have reviewed the patient's chart and labs.  Questions were answered to the patient's satisfaction.    Darnell Level, MD [facility] Office: [phone]    Daesia Zylka Judie Petit

## 2018-06-18 NOTE — Op Note (Signed)
OPERATIVE REPORT - LAPAROSCOPIC APPENDECTOMY  Preop diagnosis:  Acute appendicitis  Postop diagnosis:  Perforated acute appendicitis with diffuse peritonitis  Procedure:  Laparoscopic appendectomy  Surgeon:  Darnell Level, MD  Anesthesia:  general endotracheal  Estimated blood loss:  minimal  Preparation:  Chlora-prep  Complications:  none  Indications:  Patient is a 78 yo BM who presented to the ER with abdominal pain.  WBC elevated.  CT scan positive for acute appendicitis.  Now for appendectomy.  Procedure:  Patient is brought to the operating room and placed in a supine position on the operating room table. Following administration of general anesthesia, a time out was held and the patient's name and procedure is confirmed. Patient is then prepped and draped in the usual strict aseptic fashion.  After ascertaining that an adequate level of anesthesia has been achieved, a peri-umbilical incision is made with a #15 blade. Dissection is carried down to the fascia. Fascia is incised in the midline and the peritoneal cavity is entered cautiously. A #0-vicryl pursestring suture is placed in the fascia. An Hassan cannula is introduced under direct vision and secured with the pursestring suture. The abdomen is insufflated with carbon dioxide. The laparoscope is introduced and the abdomen is explored. Operative ports are placed in the right upper quadrant and left lower quadrant. The appendix is identified. There is obvious perforation with purulent fluid and debris in the right colic gutter.  There is diffuse peritonitis with cloudy fluid throughout the abdomen and pelvis.  The appendix is gently mobilized off of the surrounding small intestine. The mesoappendix is divided with the harmonic scalpel. Dissection is carried down to the base of the appendix. The base of the appendix is dissected out clearing the junction with the cecal wall. Using an Endo-GIA stapler, the base of the appendix is  transected at the junction with the cecal wall. There is good approximation of tissue along the staple line. There is good hemostasis along the staple line. The appendix is placed into an endo-catch bag and withdrawn through the umbilical port. The #0-vicryl pursestring suture is tied securely.  Right lower quadrant is irrigated with warm saline which is evacuated. Good hemostasis is noted. Ports are removed under direct vision. Good hemostasis is noted at the port sites. Pneumoperitoneum is released.  Skin incisions are anesthetized with local anesthetic. Wounds are closed with interrupted 4-0 Monocryl subcuticular sutures. Wounds are washed and dried and Dermabond was applied. The patient is awakened from anesthesia and brought to the recovery room. The patient tolerated the procedure well.  Darnell Level, MD [facility] Office: [phone]

## 2018-06-18 NOTE — Transfer of Care (Signed)
Immediate Anesthesia Transfer of Care Note  Patient: Larry Howard  Procedure(s) Performed: Procedure(s): APPENDECTOMY LAPAROSCOPIC (N/A)  Patient Location: PACU  Anesthesia Type:General  Level of Consciousness:  sedated, patient cooperative and responds to stimulation  Airway & Oxygen Therapy:Patient Spontanous Breathing and Patient connected to face mask oxgen  Post-op Assessment:  Report given to PACU RN and Post -op Vital signs reviewed and stable  Post vital signs:  Reviewed and stable  Last Vitals:  Vitals:   06/18/18 0604 06/18/18 0810  BP: (!) 141/78 124/74  Pulse: 99 96  Resp: 16 18  Temp: 36.9 C 37.8 C  SpO2: 90% 92%    Complications: No apparent anesthesia complications

## 2018-06-18 NOTE — Anesthesia Postprocedure Evaluation (Signed)
Anesthesia Post Note  Patient: Larry Howard  Procedure(s) Performed: APPENDECTOMY LAPAROSCOPIC (N/A )     Patient location during evaluation: PACU Anesthesia Type: General Level of consciousness: awake and alert Pain management: pain level controlled Vital Signs Assessment: post-procedure vital signs reviewed and stable Respiratory status: spontaneous breathing, nonlabored ventilation, respiratory function stable and patient connected to nasal cannula oxygen Cardiovascular status: blood pressure returned to baseline and stable Postop Assessment: no apparent nausea or vomiting Anesthetic complications: no    Last Vitals:  Vitals:   06/18/18 1500 06/18/18 1800  BP: 132/74 128/76  Pulse: 72 68  Resp: 16 18  Temp: 37 C 37 C  SpO2: 98% 100%    Last Pain:  Vitals:   06/18/18 1800  TempSrc: Oral  PainSc:                  Marirose Deveney COKER

## 2018-06-18 NOTE — Interval H&P Note (Signed)
History and Physical Interval Note:  06/18/2018 9:10 AM  Larry Howard  has presented today for surgery, with the diagnosis of Acute Appendicitis  The various methods of treatment have been discussed with the patient and family. After consideration of risks, benefits and other options for treatment, the patient has consented to  Procedure(s): APPENDECTOMY LAPAROSCOPIC (N/A) as a surgical intervention .  The patient's history has been reviewed, patient examined, no change in status, stable for surgery.  I have reviewed the patient's chart and labs.  Questions were answered to the patient's satisfaction.     Tamim Skog Judie Petit

## 2018-06-19 ENCOUNTER — Encounter (HOSPITAL_COMMUNITY): Payer: Self-pay | Admitting: Surgery

## 2018-06-19 MED ORDER — METRONIDAZOLE 500 MG PO TABS
2000.0000 mg | ORAL_TABLET | Freq: Once | ORAL | Status: AC
  Administered 2018-06-19: 2000 mg via ORAL

## 2018-06-19 MED ORDER — LISINOPRIL-HYDROCHLOROTHIAZIDE 20-12.5 MG PO TABS: 1.0000 | ORAL_TABLET | Freq: Every day | ORAL | Status: DC

## 2018-06-19 MED ORDER — ATORVASTATIN CALCIUM 20 MG PO TABS: 20.0000 mg | ORAL_TABLET | Freq: Every day | ORAL | Status: DC

## 2018-06-19 MED ORDER — HYDROCHLOROTHIAZIDE 12.5 MG PO CAPS
12.5000 mg | ORAL_CAPSULE | Freq: Every day | ORAL | Status: DC
  Administered 2018-06-19 – 2018-06-20 (×2): 12.5 mg via ORAL
  Filled 2018-06-19: qty 1

## 2018-06-19 MED ORDER — LISINOPRIL 20 MG PO TABS
20.0000 mg | ORAL_TABLET | Freq: Every day | ORAL | Status: DC
  Administered 2018-06-19 – 2018-06-20 (×2): 20 mg via ORAL
  Filled 2018-06-19: qty 1

## 2018-06-19 NOTE — Progress Notes (Addendum)
Patient ID: Larry Howard, male   DOB: 21-Apr-1941, 78 y.o.   MRN: [id number]    1 Day Post-Op  Subjective: Pleasant this morning. Reports some pain overnight that was relieved after passing flatus. No pain currently. No N/V.   Objective: Vital signs in last 24 hours: Temp:  [98.1 F (36.7 C)-99.5 F (37.5 C)] 98.1 F (36.7 C) (01/08 0544) Pulse Rate:  [68-104] 86 (01/08 0544) Resp:  [11-18] 18 (01/08 0544) BP: (125-153)/(71-89) 153/71 (01/08 0544) SpO2:  [90 %-100 %] 91 % (01/08 0544)    Intake/Output from previous day: 01/07 0701 - 01/08 0700 In: 2769 [P.O.:840; I.V.:1579.1; IV Piggyback:350] Out: 1125 [Urine:1100; Blood:25] Intake/Output this shift: No intake/output data recorded.  PE: Abd: Soft, mild distension, appropriately tender. + BS. Incision, c/d/i.   Lab Results:  Recent Labs    06/17/18 1742 06/18/18 1247  WBC 17.6* 17.8*  HGB 14.9 13.2  HCT 45.4 41.6  PLT 211 180   BMET Recent Labs    06/17/18 1742 06/18/18 1247  NA 133*  --   K 3.5  --   CL 96*  --   CO2 30  --   GLUCOSE 137*  --   BUN 9  --   CREATININE 0.91 1.24  CALCIUM 9.4  --    PT/INR No results for input(s): LABPROT, INR in the last 72 hours. CMP     Component Value Date/Time   NA 133 (L) 06/17/2018 1742   K 3.5 06/17/2018 1742   CL 96 (L) 06/17/2018 1742   CO2 30 06/17/2018 1742   GLUCOSE 137 (H) 06/17/2018 1742   BUN 9 06/17/2018 1742   CREATININE 1.24 06/18/2018 1247   CALCIUM 9.4 06/17/2018 1742   PROT 8.0 06/17/2018 1742   ALBUMIN 4.3 06/17/2018 1742   AST 22 06/17/2018 1742   ALT 12 06/17/2018 1742   ALKPHOS 89 06/17/2018 1742   BILITOT 0.9 06/17/2018 1742   GFRNONAA 57 (L) 06/18/2018 1247   GFRAA >60 06/18/2018 1247   Lipase     Component Value Date/Time   LIPASE 28 06/17/2018 1742       Studies/Results: Ct Abdomen Pelvis W Contrast  Result Date: 06/17/2018 CLINICAL DATA:  Right lower quadrant pain, onset yesterday. Nausea and vomiting. Concern for  appendicitis. EXAM: CT ABDOMEN AND PELVIS WITH CONTRAST TECHNIQUE: Multidetector CT imaging of the abdomen and pelvis was performed using the standard protocol following bolus administration of intravenous contrast. CONTRAST:  ISOVUE-300 IOPAMIDOL (ISOVUE-300) INJECTION 61% COMPARISON:  None. FINDINGS: Lower chest: Minor hypoventilatory atelectasis at the bases. No pleural fluid. Hepatobiliary: No focal liver abnormality is seen. No gallstones, gallbladder wall thickening, or biliary dilatation. Pancreas: No ductal dilatation or inflammation. Spleen: Normal in size without focal abnormality. Tiny splenule anteriorly. Adrenals/Urinary Tract: Mild left adrenal thickening without dominant nodule. Normal right adrenal gland. No hydronephrosis or perinephric edema. Homogeneous renal enhancement with symmetric excretion on delayed phase imaging. Urinary bladder is completely nondistended and not well evaluated. Stomach/Bowel: Evidence of acute appendicitis as described below. Small hiatal hernia. Stomach physiologically distended. 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 Remaining small bowel is unremarkable. Mild diverticulosis of the descending and hepatic flexure of the colon without diverticulitis. Appendix: Location: Posterior to the cecum. Diameter: 18 mm. Appendicolith: Yes, 9 mm at the base, smaller in the mid appendix. Mucosal hyper-enhancement: Present. Extraluminal gas: No. Periappendiceal collection: No well-defined collection. Prominent periappendiceal stranding and small amount of free fluid. 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 Mesenteric vessels and portal vein are patent. No adenopathy. Reproductive: Prostate is unremarkable. Other: Periappendiceal stranding and free fluid in the right lower quadrant without well-defined abscess. Small amount of free fluid tracks into the dependent  pelvis. No free air. No abdominal wall hernia. Musculoskeletal: Degenerative change in the spine, sacroiliac joints, and hips. There are no acute or suspicious osseous abnormalities. IMPRESSION: 1. Acute appendicitis. Prominent periappendiceal inflammation and small amount of free fluid but no evidence of rupture. No abscess or extraluminal air. 2. Mild colonic diverticulosis without diverticulitis. 3. 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 Recommend followup by ultrasound in 5 years. This recommendation follows ACR consensus guidelines: White Paper of the ACR Incidental Findings Committee II on Vascular Findings. J Am Coll Radiol 2013; 10:789-794. Electronically Signed   By: Narda Rutherford M.D.   On: 06/17/2018 20:22    Anti-infectives: Anti-infectives (From admission, onward)   Start     Dose/Rate Route Frequency Ordered Stop   06/18/18 1400  piperacillin-tazobactam (ZOSYN) IVPB 3.375 g     3.375 g 12.5 mL/hr over 240 Minutes Intravenous Every 8 hours 06/18/18 1155     06/17/18 2300  cefTRIAXone (ROCEPHIN) 2 g in sodium chloride 0.9 % 100 mL IVPB  Status:  Discontinued     2 g 200 mL/hr over 30 Minutes Intravenous Every 24 hours 06/17/18 2249 06/18/18 1147   06/17/18 2300  metroNIDAZOLE (FLAGYL) IVPB 500 mg  Status:  Discontinued     500 mg 100 mL/hr over 60 Minutes Intravenous Every 8 hours 06/17/18 2249 06/18/18 1147   06/17/18 2045  piperacillin-tazobactam (ZOSYN) IVPB 3.375 g     3.375 g 100 mL/hr over 30 Minutes Intravenous  Once 06/17/18 2038 06/17/18 2112       Assessment/Plan POD #1 s/p Laparoscopic appendectomy for perforated acute appendicitis with diffuse peritonitis, Dr. Darnell Level -Minimal pain, no N/V, passing flatus. Tolerating clears. Will advance diet as tolerated.  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

## 2018-06-20 LAB — CBC
HCT: 40 % (ref 39.0–52.0)
Hemoglobin: 12.7 g/dL — ABNORMAL LOW (ref 13.0–17.0)
MCH: 33.6 pg (ref 26.0–34.0)
MCHC: 31.8 g/dL (ref 30.0–36.0)
MCV: 105.8 fL — ABNORMAL HIGH (ref 80.0–100.0)
Platelets: 197 10*3/uL (ref 150–400)
RBC: 3.78 MIL/uL — ABNORMAL LOW (ref 4.22–5.81)
RDW: 12.8 % (ref 11.5–15.5)
WBC: 11 10*3/uL — ABNORMAL HIGH (ref 4.0–10.5)
nRBC: 0 % (ref 0.0–0.2)

## 2018-06-20 LAB — BASIC METABOLIC PANEL
Anion gap: 9 (ref 5–15)
BUN: 14 mg/dL (ref 8–23)
CO2: 28 mmol/L (ref 22–32)
Calcium: 8.7 mg/dL — ABNORMAL LOW (ref 8.9–10.3)
Chloride: 103 mmol/L (ref 98–111)
Creatinine, Ser: 1.2 mg/dL (ref 0.61–1.24)
GFR calc Af Amer: >60 mL/min (ref >60)
GFR calc non Af Amer: 59 mL/min — ABNORMAL LOW (ref >60)
Glucose, Bld: 138 mg/dL — ABNORMAL HIGH (ref 70–99)
Potassium: 3.6 mmol/L (ref 3.5–5.1)
Sodium: 140 mmol/L (ref 135–145)

## 2018-06-20 NOTE — Progress Notes (Signed)
Patient ID: Larry Howard, male   DOB: February 23, 1941, 78 y.o.   MRN: [id number]    2 Days Post-Op  Subjective: Doing well this morning. No abdominal pain. No N/V. BM overnight. Ambulating and using IS. Tolerating diet.   Objective: Vital signs in last 24 hours: Temp:  [98.3 F (36.8 C)-99.2 F (37.3 C)] 98.3 F (36.8 C) (01/09 0518) Pulse Rate:  [86-93] 86 (01/09 0518) Resp:  [16-18] 18 (01/09 0518) BP: (109-161)/(85-91) 161/91 (01/09 0518) SpO2:  [90 %-97 %] 91 % (01/09 0518)    Intake/Output from previous day: 01/08 0701 - 01/09 0700 In: 1520.1 [P.O.:1320; IV Piggyback:200.1] Out: 950 [Urine:950] Intake/Output this shift: No intake/output data recorded.  PE: Abd: Soft, non-distended. Appropriately tender. +BS. Incision with some ecchymosis surrounding but otherwise c/d/i.   Lab Results:  Recent Labs    06/18/18 1247 06/20/18 0459  WBC 17.8* 11.0*  HGB 13.2 12.7*  HCT 41.6 40.0  PLT 180 197   BMET Recent Labs    06/17/18 1742 06/18/18 1247 06/20/18 0459  NA 133*  --  140  K 3.5  --  3.6  CL 96*  --  103  CO2 30  --  28  GLUCOSE 137*  --  138*  BUN 9  --  14  CREATININE 0.91 1.24 1.20  CALCIUM 9.4  --  8.7*   PT/INR No results for input(s): LABPROT, INR in the last 72 hours. CMP     Component Value Date/Time   NA 140 06/20/2018 0459   K 3.6 06/20/2018 0459   CL 103 06/20/2018 0459   CO2 28 06/20/2018 0459   GLUCOSE 138 (H) 06/20/2018 0459   BUN 14 06/20/2018 0459   CREATININE 1.20 06/20/2018 0459   CALCIUM 8.7 (L) 06/20/2018 0459   PROT 8.0 06/17/2018 1742   ALBUMIN 4.3 06/17/2018 1742   AST 22 06/17/2018 1742   ALT 12 06/17/2018 1742   ALKPHOS 89 06/17/2018 1742   BILITOT 0.9 06/17/2018 1742   GFRNONAA 59 (L) 06/20/2018 0459   GFRAA >60 06/20/2018 0459   Lipase     Component Value Date/Time   LIPASE 28 06/17/2018 1742       Studies/Results: No results found.  Anti-infectives: Anti-infectives (From admission, onward)   Start      Dose/Rate Route Frequency Ordered Stop   06/19/18 1000  metroNIDAZOLE (FLAGYL) tablet 2,000 mg    Note to Pharmacy:  For Trich   2,000 mg Oral  Once 06/19/18 0912 06/19/18 1749   06/18/18 1400  piperacillin-tazobactam (ZOSYN) IVPB 3.375 g     3.375 g 12.5 mL/hr over 240 Minutes Intravenous Every 8 hours 06/18/18 1155     06/17/18 2300  cefTRIAXone (ROCEPHIN) 2 g in sodium chloride 0.9 % 100 mL IVPB  Status:  Discontinued     2 g 200 mL/hr over 30 Minutes Intravenous Every 24 hours 06/17/18 2249 06/18/18 1147   06/17/18 2300  metroNIDAZOLE (FLAGYL) IVPB 500 mg  Status:  Discontinued     500 mg 100 mL/hr over 60 Minutes Intravenous Every 8 hours 06/17/18 2249 06/18/18 1147   06/17/18 2045  piperacillin-tazobactam (ZOSYN) IVPB 3.375 g     3.375 g 100 mL/hr over 30 Minutes Intravenous  Once 06/17/18 2038 06/17/18 2112       Assessment/Plan POD #2 s/p Laparoscopic appendectomy for perforated acute appendicitis with diffuse peritonitis, Dr. Darnell Level -Doing well. No pain or N/V. +BS. BM overnight. Flatus. Tolerating fulls. Advanced to [facility] regular diet.  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

## 2018-06-21 MED ORDER — AMOXICILLIN-POT CLAVULANATE 875-125 MG PO TABS: 1.0000 | ORAL_TABLET | Freq: Two times a day (BID) | ORAL | 0 refills | Status: DC

## 2018-06-21 MED ORDER — OXYCODONE HCL 5 MG PO TABS: 5.0000 mg | ORAL_TABLET | Freq: Three times a day (TID) | ORAL | 0 refills | Status: AC | PRN

## 2018-06-21 MED ORDER — AMOXICILLIN-POT CLAVULANATE 875-125 MG PO TABS: 1.0000 | ORAL_TABLET | Freq: Two times a day (BID) | ORAL | 0 refills | Status: AC

## 2018-06-21 MED ORDER — OXYCODONE HCL 5 MG PO TABS: 5.0000 mg | ORAL_TABLET | Freq: Three times a day (TID) | ORAL | 0 refills | Status: DC | PRN

## 2018-06-21 NOTE — Discharge Instructions (Signed)
[facility] ° °Please arrive at least 30 min before your appointment to complete your check in paperwork.  If you are unable to arrive 30 min prior to your appointment time we may have to cancel or reschedule you. °LAPAROSCOPIC SURGERY: POST OP INSTRUCTIONS °Always review your discharge instruction sheet given to you by the facility where your surgery was performed. °IF YOU HAVE DISABILITY OR FAMILY LEAVE FORMS, YOU MUST BRING THEM TO THE OFFICE FOR PROCESSING.   °DO NOT GIVE THEM TO YOUR DOCTOR. ° °PAIN CONTROL ° °1. First take acetaminophen (Tylenol) AND/or ibuprofen (Advil) to control your pain after surgery.  Follow directions on package.  Taking acetaminophen (Tylenol) and/or ibuprofen (Advil) regularly after surgery will help to control your pain and lower the amount of prescription pain medication you may need.  You should not take more than 4,000 mg (4 grams) of acetaminophen (Tylenol) in 24 hours.  You should not take ibuprofen (Advil), aleve, motrin, naprosyn or other NSAIDS if you have a history of stomach ulcers or chronic kidney disease.  °2. A prescription for pain medication may be given to you upon discharge.  Take your pain medication as prescribed, if you still have uncontrolled pain after taking acetaminophen (Tylenol) or ibuprofen (Advil). °3. Use ice packs to help control pain. °4. If you need a refill on your pain medication, please contact your pharmacy.  They will contact our office to request authorization. Prescriptions will not be filled after 5pm or on week-ends. ° °HOME MEDICATIONS °5. Take your usually prescribed medications unless otherwise directed. ° °DIET °6. You should follow a light diet the first few days after arrival home.  Be sure to include lots of fluids daily. Avoid fatty, fried foods.  ° °CONSTIPATION °7. It is common to experience some constipation after surgery and if you are taking pain medication.  Increasing fluid intake and taking a stool  softener (such as Colace) will usually help or prevent this problem from occurring.  A mild laxative (Milk of Magnesia or Miralax) should be taken according to package instructions if there are no bowel movements after 48 hours. ° °WOUND/INCISION CARE °8. Most patients will experience some swelling and bruising in the area of the incisions.  Ice packs will help.  Swelling and bruising can take several days to resolve.  °9. Unless discharge instructions indicate otherwise, follow guidelines below  °a. STERI-STRIPS - you may remove your outer bandages 48 hours after surgery, and you may shower at that time.  You have steri-strips (small skin tapes) in place directly over the incision.  These strips should be left on the skin for 7-10 days.   °b. DERMABOND/SKIN GLUE - you may shower in 24 hours.  The glue will flake off over the next 2-3 weeks. °10. Any sutures or staples will be removed at the office during your follow-up visit. ° °ACTIVITIES °11. You may resume regular (light) daily activities beginning the next day--such as daily self-care, walking, climbing stairs--gradually increasing activities as tolerated.  You may have sexual intercourse when it is comfortable.  Refrain from any heavy lifting or straining until approved by your doctor. °a. You may drive when you are no longer taking prescription pain medication, you can comfortably wear a seatbelt, and you can safely maneuver your car and apply brakes. ° °FOLLOW-UP °12. You should see your doctor in the office for a follow-up appointment approximately 2-3 weeks after your surgery.  You should have been given your post-op/follow-up appointment when   your surgery was scheduled.  If you did not receive a post-op/follow-up appointment, make sure that you call for this appointment within a day or two after you arrive home to insure a convenient appointment time.   WHEN TO CALL YOUR DOCTOR: 1. Fever over 101.0 2. Inability to urinate 3. Continued bleeding from  incision. 4. Increased pain, redness, or drainage from the incision. 5. Increasing abdominal pain  The clinic staff is available to answer your questions during regular business hours.  Please dont hesitate to call and ask to speak to one of the nurses for clinical concerns.  If you have a medical emergency, go to the nearest emergency room or call 911.  A surgeon from [facility] is always on call at the hospital. [address], [place], [place]  [address] ? [address] [address], [place], [place]   [address] [phone] ? [phone] ? 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 This increases your risk for aneurysmal development. It is recommended that you follow up with your primary care doctor for this. Guidelines recommend an ultrasound in 5 years to monitor this.   As we discussed, your urinalysis in the ED also showed you were positive for Trichomoniasis. Trichomoniasis is an STI (sexually transmitted infection) that can affect both women and men. You were treated for this in the hospital. Please follow up with your primary care doctor in regards to this. You can follow up at the health department for other STD testing. Please notify all sexual partners in the last 6 months so they can be tested. Please remain sexually abstinent until sexual partners are tested/treated.

## 2018-06-21 NOTE — Progress Notes (Signed)
Assessment unchanged. Pt verbalized understanding of dc instructions through teach back including follow up care and when to call the doctor. No scripts at [employer], MD electronically sent. Discharged via wc to front entrance accompanied by daughter and NT.

## 2018-06-21 NOTE — Discharge Summary (Signed)
Patient ID: Larry Howard [id number] 1940/07/30 78 y.o.  Admit date: 06/17/2018 Discharge date: 06/21/2018  Admitting Diagnosis: Acute appendicitis  Discharge Diagnosis Patient Active Problem List   Diagnosis Date Noted  . Acute fulminating appendicitis with perforation and peritonitis 06/18/2018  . Appendicitis 06/17/2018    Consultants None  Reason for Admission: The patient is a 78 year old black male who began having abdominal pain yesterday afternoon.  He denies any fevers or chills.  He denies any nausea or vomiting.  His pain has gotten progressively worse and he came to the emergency department today.  A CT scan showed inflammation around the appendix consistent with acute appendicitis.  Procedures Laparoscopic appendectomy  Hospital Course:  Acute appendicitis with diffuse peritonitis The patient was admitted and underwent a laparoscopic appendectomy. The patient was found to have perforated acute appendicitis with diffuse peritonitis. The patient tolerated the procedure well. He was started on IV abx. On POD 1, the patient was tolerating clear diet, voiding well, mobilizing, and pain was controlled. Pt was progressed to regular diet and tolerated. He has continued to void well, has passed several BM, pain is controlled and he is mobilizing. At discharge patient was switched to PO Augmentin for 10 days. The patient was stable for DC home at this time with appropriate follow up made.  Trichomonas  He was found to have Trichomonas on UA in the ED. This was treated on the floor with 2g flagyl x 1. He was instructed to follow up with PCP in regards to this. He is to inform all sexual partners in the last 6 months and remain sexually abstinent until sexual partners can be tested and treated. Patient informed of finding, states understanding and time was given for all questions to be answered.   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  Suspect this was likely an incidental finding.  I have discussed this with the patient and he states understanding.  He is to follow-up with his PCP in regards to this.  We will forward the discharge summary to patient's PCP.  He was instructed to have a follow-up ultrasound in 5 years per guidelines outlined on CT report.  Physical Exam: Abd: Soft, non-distended. Appropriately tender. +BS. Incisions with some ecchymosis surrounding but otherwise c/d/i.   Allergies as of 06/21/2018   No Known Allergies     Medication List    TAKE these medications   amoxicillin-clavulanate 875-125 MG tablet Commonly known as:  AUGMENTIN Take 1 tablet by mouth every 12 (twelve) hours.   atorvastatin 20 MG tablet Commonly known as:  LIPITOR Take 20 mg by mouth daily.   lisinopril-hydrochlorothiazide 20-12.5 MG tablet Commonly known as:  PRINZIDE,ZESTORETIC Take 1 tablet by mouth daily.   oxyCODONE 5 MG immediate release tablet Commonly known as:  ROXICODONE Take 1 tablet (5 mg total) by mouth every 8 (eight) hours as needed.        Follow-up Information    [facility] Follow up on 07/04/2018.   Specialty:  General Surgery Why:  Your appointment is on 01/23 at 2:45 pm. Please arrive on 1/23 at 2:15pm (30 minutes early) to fill out paperwork before your appointment. You will need to bring a copy of a photo ID and your insurance card.  Contact information: [address] [place] [address] [phone]        Laureen Ochs A, FNP Follow up in 1 week(s).  Specialty:  Nurse Practitioner Why:  Please follow up with your primary care provider in regards to your trichimonias and ectactic aorta (as we discussed) Contact information: [address] [place] [id number] [phone]           Signed: Leary RocaMichael Mardelle Pandolfi, [facility] 06/21/2018, 9:18 AM Pager: [phone] Monday: 7am - 11:30am Tuesday - Friday: 7am -  4:30pm Saturday - Sunday: N/A

## 2019-12-23 IMAGING — CT CT ABD-PELV W/ CM
2 of 5 series · 15 of 46 positions shown, 17 images · IV contrast (APPLIED)
Comparison: None.

CLINICAL DATA: Right lower quadrant pain, onset yesterday. Nausea
and vomiting. Concern for appendicitis.

EXAM:
CT ABDOMEN AND PELVIS WITH CONTRAST
TECHNIQUE: Multidetector CT imaging of the abdomen and pelvis was performed
using the standard protocol following bolus administration of
intravenous contrast.
CONTRAST:  100mL BSVMPD-7SS IOPAMIDOL (BSVMPD-7SS) INJECTION 61%

[Series 2: axial st · axial · 0.82mm/px · z∈[+511,+916]mm · 12 of 93 slices shown, 14 images]
[im 6/93  soft-tissue]
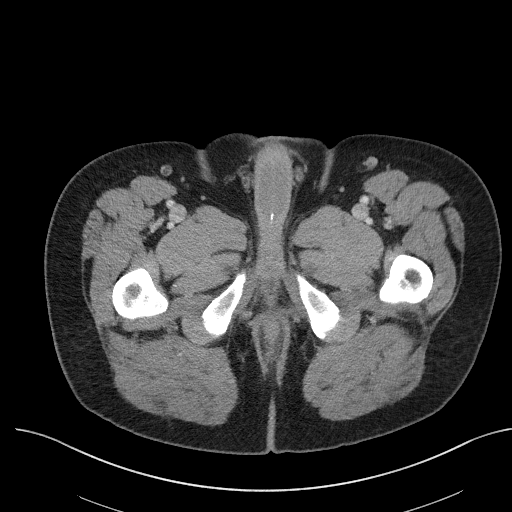
[im 6/93  bone]
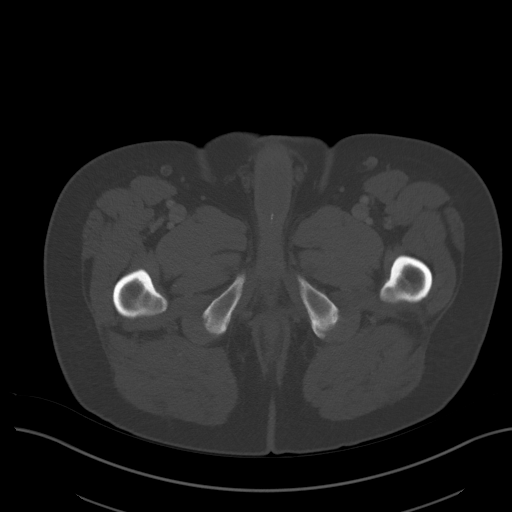
[im 12/93  soft-tissue]
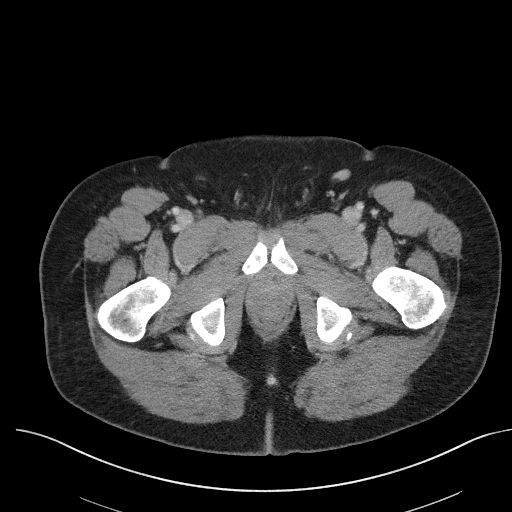
[im 24/93  soft-tissue]
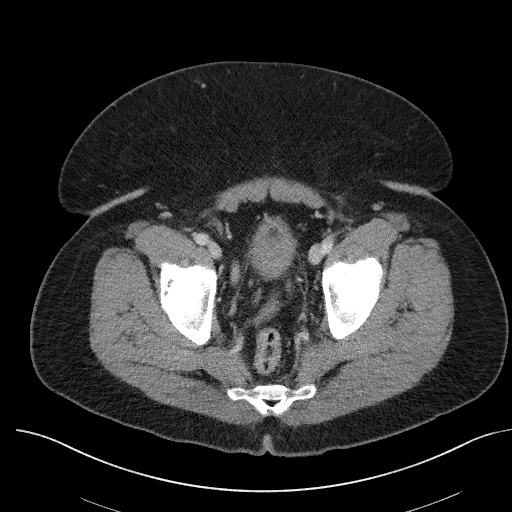
[im 29/93  soft-tissue]
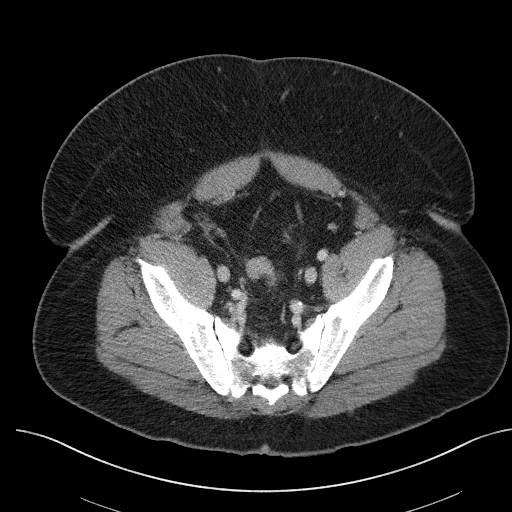
[im 35/93  soft-tissue]
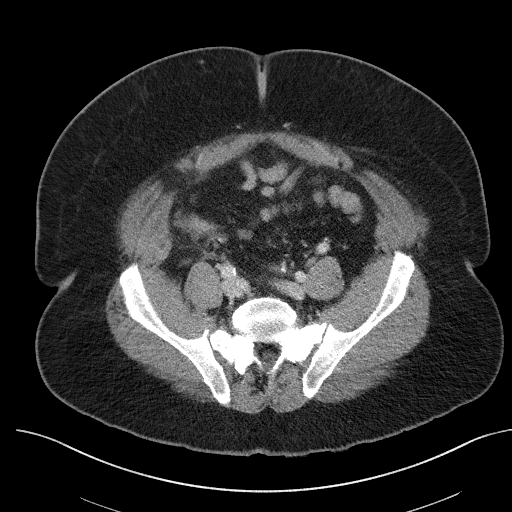
[im 41/93  soft-tissue]
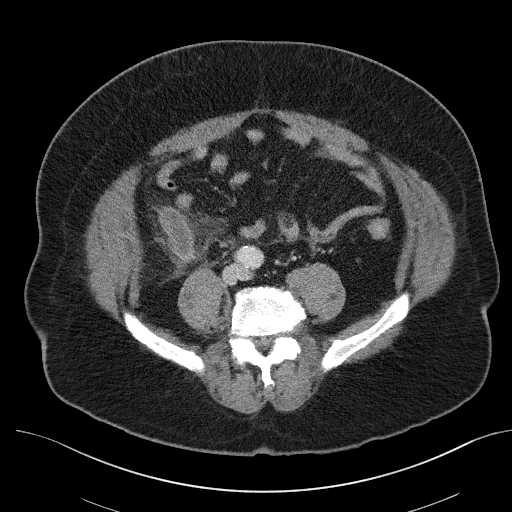
[im 52/93  soft-tissue]
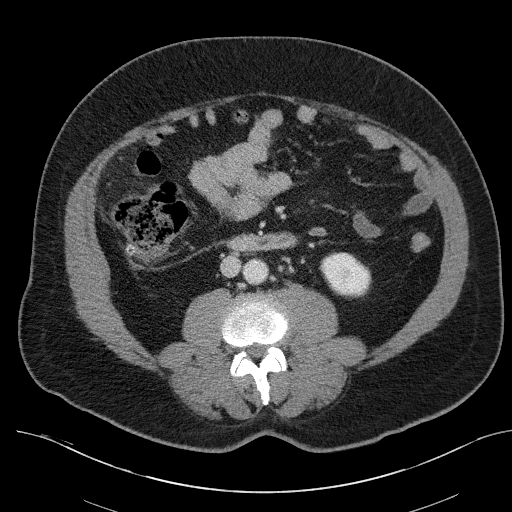
[im 58/93  soft-tissue]
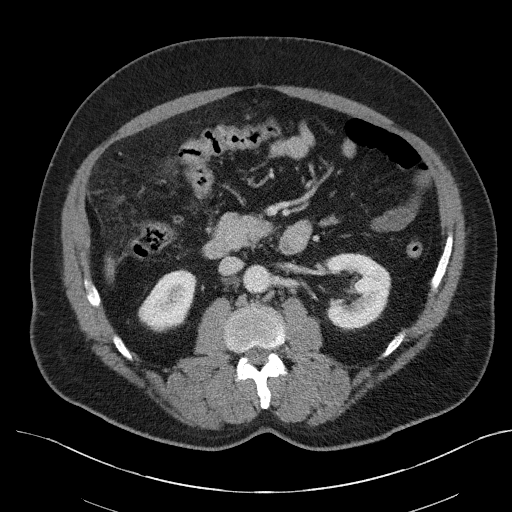
[im 64/93  soft-tissue]
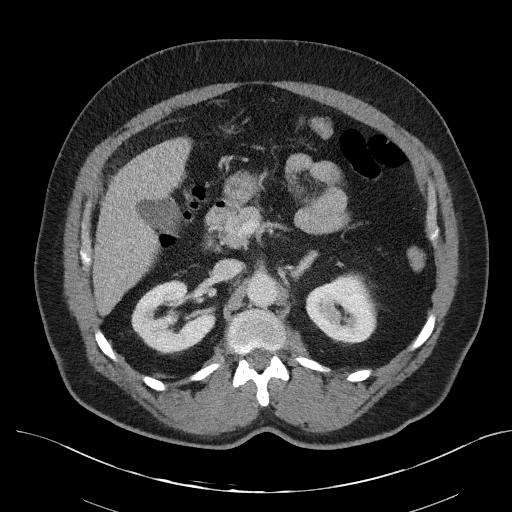
[im 64/93  bone]
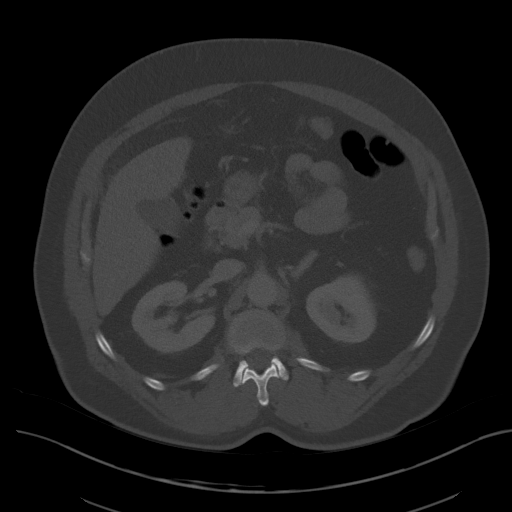
[im 70/93  soft-tissue]
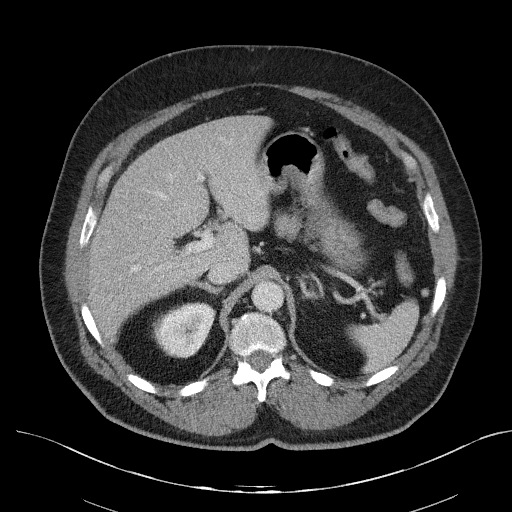
[im 81/93  soft-tissue]
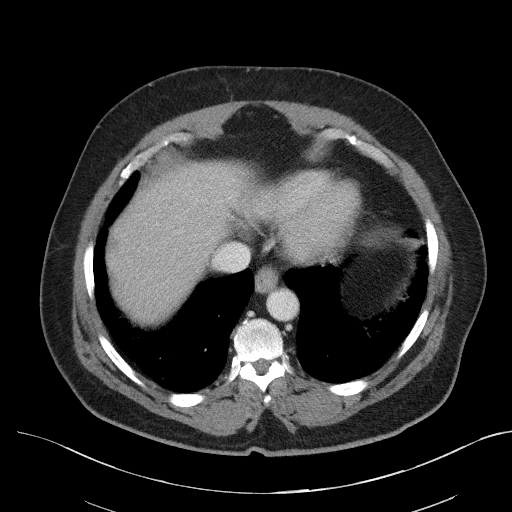
[im 87/93  soft-tissue]
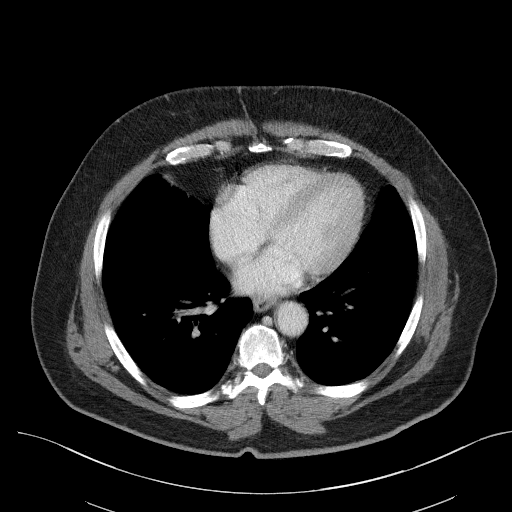

[Series 5: coronal st · coronal · 0.75mm/px · 3 of 112 slices shown]
[im 38/112  soft-tissue]
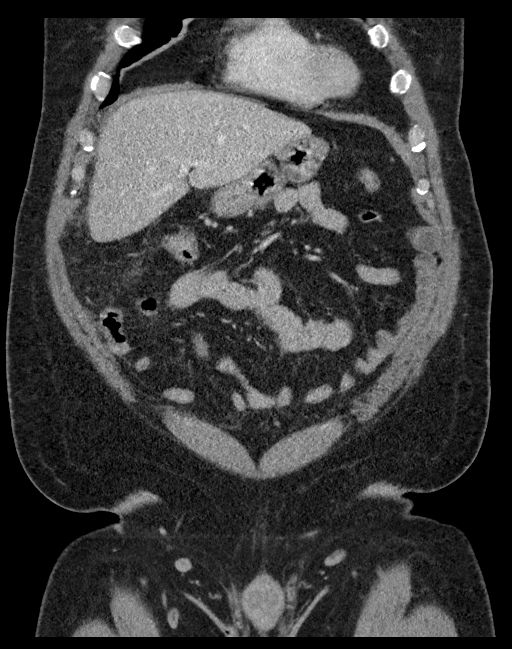
[im 50/112  soft-tissue]
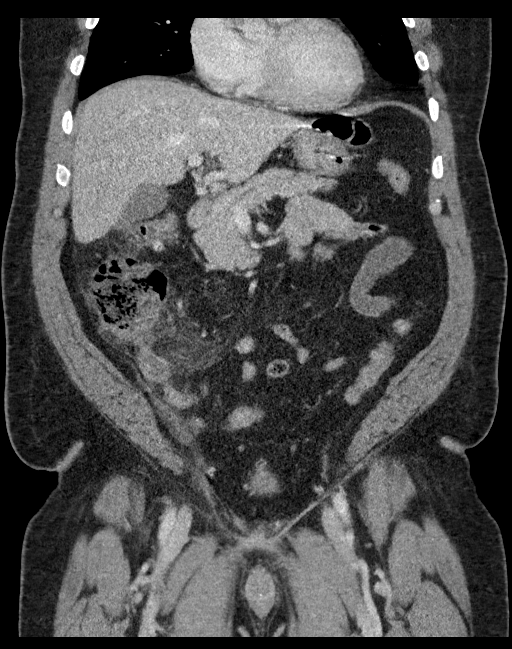
[im 62/112  soft-tissue]
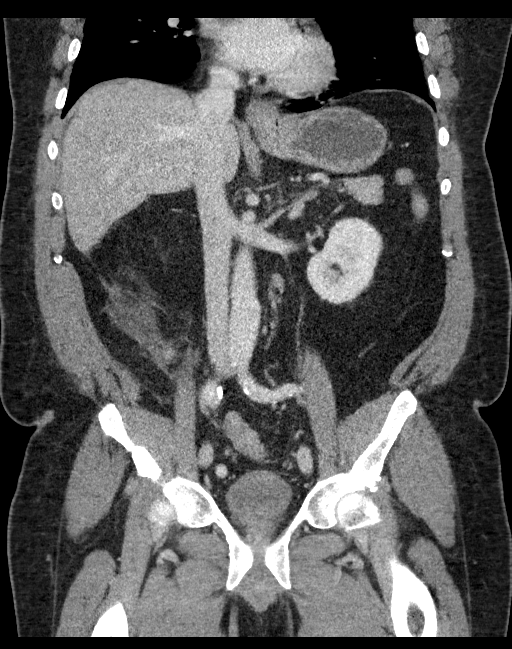

[15 of 46 positions shown; findings below may reference images not displayed]

FINDINGS: Lower chest: Minor hypoventilatory atelectasis at the bases. No
pleural fluid.

Hepatobiliary: No focal liver abnormality is seen. No gallstones,
gallbladder wall thickening, or biliary dilatation.

Pancreas: No ductal dilatation or inflammation.

Spleen: Normal in size without focal abnormality. Tiny splenule
anteriorly.

Adrenals/Urinary Tract: Mild left adrenal thickening without
dominant nodule. Normal right adrenal gland. No hydronephrosis or
perinephric edema. Homogeneous renal enhancement with symmetric
excretion on delayed phase imaging. Urinary bladder is completely
nondistended and not well evaluated.

Stomach/Bowel: Evidence of acute appendicitis as described below.
Small hiatal hernia. Stomach physiologically distended. Incidental
intramural tubular lipoma in the fourth portion of the duodenum
spans 3.2 cm, no obstruction. Remaining small bowel is unremarkable.
Mild diverticulosis of the descending and hepatic flexure of the
colon without diverticulitis.

Appendix: Location: Posterior to the cecum.

Diameter: 18 mm.

Appendicolith: Yes, 9 mm at the base, smaller in the mid appendix.

Mucosal hyper-enhancement: Present.

Extraluminal gas: No.

Periappendiceal collection: No well-defined collection. Prominent
periappendiceal stranding and small amount of free fluid.

Vascular/Lymphatic: Ectatic infrarenal aorta at 2.7 cm. Mild
bi-iliac atherosclerosis. Mesenteric vessels and portal vein are
patent. No adenopathy.

Reproductive: Prostate is unremarkable.

Other: Periappendiceal stranding and free fluid in the right lower
quadrant without well-defined abscess. Small amount of free fluid
tracks into the dependent pelvis. No free air. No abdominal wall
hernia.

Musculoskeletal: Degenerative change in the spine, sacroiliac
joints, and hips. There are no acute or suspicious osseous
abnormalities.
IMPRESSION: 1. Acute appendicitis. Prominent periappendiceal inflammation and
small amount of free fluid but no evidence of rupture. No abscess or
extraluminal air.
2. Mild colonic diverticulosis without diverticulitis.
3. Ectatic infrarenal aorta at 2.7 cm. Ectatic abdominal aorta at
risk for aneurysm development. Recommend followup by ultrasound in 5
years. This recommendation follows ACR consensus guidelines: White
Paper of the ACR Incidental Findings Committee II on Vascular
Findings. [HOSPITAL] 1004; [DATE].
